# Patient Record
Sex: Female | Born: 1935 | Race: White | Hispanic: No | Marital: Married | Smoking: Former smoker
Health system: Southern US, Community
[De-identification: ages and names within clinical notes are randomized; demographics above are authoritative.]

## PROBLEM LIST (undated history)

## (undated) DIAGNOSIS — I639 Cerebral infarction, unspecified: Secondary | ICD-10-CM

## (undated) DIAGNOSIS — I10 Essential (primary) hypertension: Secondary | ICD-10-CM

## (undated) DIAGNOSIS — C229 Malignant neoplasm of liver, not specified as primary or secondary: Secondary | ICD-10-CM

## (undated) DIAGNOSIS — R569 Unspecified convulsions: Secondary | ICD-10-CM

## (undated) DIAGNOSIS — E119 Type 2 diabetes mellitus without complications: Secondary | ICD-10-CM

## (undated) HISTORY — PX: APPENDECTOMY: SHX54

## (undated) HISTORY — PX: TONSILLECTOMY: SUR1361

## (undated) HISTORY — PX: ABDOMINAL HYSTERECTOMY: SHX81

---

## 2004-06-30 ENCOUNTER — Inpatient Hospital Stay (HOSPITAL_COMMUNITY): Admission: RE | Admit: 2004-06-30 | Discharge: 2004-07-03 | Payer: Self-pay | Admitting: Orthopedic Surgery

## 2004-06-30 ENCOUNTER — Ambulatory Visit: Payer: Self-pay | Admitting: Orthopedic Surgery

## 2004-07-03 ENCOUNTER — Inpatient Hospital Stay
Admission: RE | Admit: 2004-07-03 | Discharge: 2004-07-14 | Payer: Self-pay | Admitting: Physical Medicine & Rehabilitation

## 2004-07-03 ENCOUNTER — Ambulatory Visit: Payer: Self-pay | Admitting: Physical Medicine & Rehabilitation

## 2004-07-04 ENCOUNTER — Ambulatory Visit (HOSPITAL_COMMUNITY)
Admission: RE | Admit: 2004-07-04 | Discharge: 2004-07-04 | Payer: Self-pay | Admitting: Physical Medicine & Rehabilitation

## 2014-08-12 ENCOUNTER — Emergency Department (HOSPITAL_BASED_OUTPATIENT_CLINIC_OR_DEPARTMENT_OTHER): Payer: Medicare Other

## 2014-08-12 ENCOUNTER — Emergency Department (HOSPITAL_BASED_OUTPATIENT_CLINIC_OR_DEPARTMENT_OTHER)
Admission: EM | Admit: 2014-08-12 | Discharge: 2014-08-12 | Disposition: A | Discharge status: Home / Self Care | Payer: Medicare Other | Attending: Emergency Medicine | Admitting: Emergency Medicine

## 2014-08-12 ENCOUNTER — Encounter (HOSPITAL_BASED_OUTPATIENT_CLINIC_OR_DEPARTMENT_OTHER): Payer: Self-pay | Admitting: Emergency Medicine

## 2014-08-12 DIAGNOSIS — E119 Type 2 diabetes mellitus without complications: Secondary | ICD-10-CM | POA: Insufficient documentation

## 2014-08-12 DIAGNOSIS — Z87891 Personal history of nicotine dependence: Secondary | ICD-10-CM | POA: Diagnosis not present

## 2014-08-12 DIAGNOSIS — R05 Cough: Secondary | ICD-10-CM

## 2014-08-12 DIAGNOSIS — R059 Cough, unspecified: Secondary | ICD-10-CM

## 2014-08-12 DIAGNOSIS — I10 Essential (primary) hypertension: Secondary | ICD-10-CM | POA: Diagnosis not present

## 2014-08-12 DIAGNOSIS — J209 Acute bronchitis, unspecified: Secondary | ICD-10-CM | POA: Insufficient documentation

## 2014-08-12 DIAGNOSIS — Z8505 Personal history of malignant neoplasm of liver: Secondary | ICD-10-CM | POA: Diagnosis not present

## 2014-08-12 HISTORY — DX: Essential (primary) hypertension: I10

## 2014-08-12 HISTORY — DX: Type 2 diabetes mellitus without complications: E11.9

## 2014-08-12 HISTORY — DX: Malignant neoplasm of liver, not specified as primary or secondary: C22.9

## 2014-08-12 MED ORDER — AZITHROMYCIN 250 MG PO TABS: ORAL_TABLET | ORAL | Status: AC

## 2014-08-12 MED ORDER — GUAIFENESIN-CODEINE 100-10 MG/5ML PO SOLN: 10.0000 mL | Freq: Four times a day (QID) | ORAL | Status: AC | PRN

## 2014-08-12 NOTE — ED Notes (Signed)
Cold sx, cough and fever x 1 week- has been using delsym, mucinex and benadryl

## 2014-08-12 NOTE — Discharge Instructions (Signed)
Zithromax as prescribed.  Robitussin with codeine as needed for cough.  Return to the emergency department for difficulty breathing, high fever, chest pain, or other new and concerning symptoms.   Acute Bronchitis Bronchitis is inflammation of the airways that extend from the windpipe into the lungs (bronchi). The inflammation often causes mucus to develop. This leads to a cough, which is the most common symptom of bronchitis.  In acute bronchitis, the condition usually develops suddenly and goes away over time, usually in a couple weeks. Smoking, allergies, and asthma can make bronchitis worse. Repeated episodes of bronchitis may cause further lung problems.  CAUSES Acute bronchitis is most often caused by the same virus that causes a cold. The virus can spread from person to person (contagious) through coughing, sneezing, and touching contaminated objects. SIGNS AND SYMPTOMS   Cough.   Fever.   Coughing up mucus.   Body aches.   Chest congestion.   Chills.   Shortness of breath.   Sore throat.  DIAGNOSIS  Acute bronchitis is usually diagnosed through a physical exam. Your health care provider will also ask you questions about your medical history. Tests, such as chest X-rays, are sometimes done to rule out other conditions.  TREATMENT  Acute bronchitis usually goes away in a couple weeks. Oftentimes, no medical treatment is necessary. Medicines are sometimes given for relief of fever or cough. Antibiotic medicines are usually not needed but may be prescribed in certain situations. In some cases, an inhaler may be recommended to help reduce shortness of breath and control the cough. A cool mist vaporizer may also be used to help thin bronchial secretions and make it easier to clear the chest.  HOME CARE INSTRUCTIONS  Get plenty of rest.   Drink enough fluids to keep your urine clear or pale yellow (unless you have a medical condition that requires fluid restriction).  Increasing fluids may help thin your respiratory secretions (sputum) and reduce chest congestion, and it will prevent dehydration.   Take medicines only as directed by your health care provider.  If you were prescribed an antibiotic medicine, finish it all even if you start to feel better.  Avoid smoking and secondhand smoke. Exposure to cigarette smoke or irritating chemicals will make bronchitis worse. If you are a smoker, consider using nicotine gum or skin patches to help control withdrawal symptoms. Quitting smoking will help your lungs heal faster.   Reduce the chances of another bout of acute bronchitis by washing your hands frequently, avoiding people with cold symptoms, and trying not to touch your hands to your mouth, nose, or eyes.   Keep all follow-up visits as directed by your health care provider.  SEEK MEDICAL CARE IF: Your symptoms do not improve after 1 week of treatment.  SEEK IMMEDIATE MEDICAL CARE IF:  You develop an increased fever or chills.   You have chest pain.   You have severe shortness of breath.  You have bloody sputum.   You develop dehydration.  You faint or repeatedly feel like you are going to pass out.  You develop repeated vomiting.  You develop a severe headache. MAKE SURE YOU:   Understand these instructions.  Will watch your condition.  Will get help right away if you are not doing well or get worse. Document Released: 11/12/2004 Document Revised: 02/19/2014 Document Reviewed: 03/28/2013 [facility] Patient Information 2015 [place], [place]. This information is not intended to replace advice given to you by your health care provider. Make sure you discuss any  questions you have with your health care provider.

## 2014-08-12 NOTE — ED Provider Notes (Signed)
CSN: [id number]     Arrival date & time 08/12/14  1742 History  This chart was scribed for Joy Speak, MD by Peyton Bottoms, ED Scribe. This patient was seen in room MH05/MH05 and the patient's care was started at 6:01 PM.   Chief Complaint  Patient presents with  . Cough   Patient is a 80 y.o. female presenting with cough. The history is provided by the patient. No language interpreter was used.  Cough Cough characteristics:  Productive Sputum characteristics:  Green Severity:  Moderate Onset quality:  Gradual Duration:  1 week Timing:  Constant Progression:  Waxing and waning Chronicity:  New Ineffective treatments: delsym, mucinex, benadryl. Associated symptoms: fever   Associated symptoms: no chest pain and no shortness of breath    HPI Comments: ADDALIE Oneal is a 81 y.o. female who presents to the Emergency Department complaining of a productive cough with green sputum, fever, and congestion that began 1 week ago. Per daughter, patient started having a low grade fever earlier today. She denies SOB, difficulty breathing, chest pain. Patient's daughter had URI 2 weeks ago. Patient is a nonsmoker. Patient denies prior history of CHF or other cardiac problems.  Past Medical History  Diagnosis Date  . Hypertension   . Diabetes mellitus without complication   . Liver cancer    Past Surgical History  Procedure Laterality Date  . Appendectomy    . Tonsillectomy    . Abdominal hysterectomy     No family history on file. History  Substance Use Topics  . Smoking status: Former Research scientist (life sciences)  . Smokeless tobacco: Not on file  . Alcohol Use: Not on file   OB History   Grav Para Term Preterm Abortions TAB SAB Ect Mult Living                 Review of Systems  Constitutional: Positive for fever.  Respiratory: Positive for cough. Negative for shortness of breath.   Cardiovascular: Negative for chest pain.  All other systems reviewed and are negative.  A complete 10 system  review of systems was obtained and all systems are negative except as noted in the HPI and PMH.   Allergies  Review of patient's allergies indicates not on file.  Home Medications   Prior to Admission medications   Not on File   Triage Vitals: BP 137/76  Pulse 98  Temp(Src) 98.6 F (37 C) (Oral)  Resp 22  Ht 5\' 9"  (1.753 m)  Wt 155 lb (70.308 kg)  BMI 22.88 kg/m2  SpO2 97%  Physical Exam  Nursing note and vitals reviewed. Constitutional: She is oriented to person, place, and time. She appears well-developed and well-nourished. No distress.  HENT:  Head: Normocephalic and atraumatic.  Mouth/Throat: Oropharynx is clear and moist.  Eyes: Conjunctivae and EOM are normal.  Neck: Neck supple. No tracheal deviation present.  Cardiovascular: Normal rate, regular rhythm and normal heart sounds.   No murmur heard. Pulmonary/Chest: Effort normal. No respiratory distress. She has no wheezes. She has rales.  Slight rales in the right base  Abdominal: Soft. Bowel sounds are normal. She exhibits no distension. There is no tenderness.  Musculoskeletal: Normal range of motion. She exhibits no edema.  Neurological: She is alert and oriented to person, place, and time.  Skin: Skin is warm and dry.  Psychiatric: She has a normal mood and affect. Her behavior is normal.   ED Course  Procedures (including critical care time)  DIAGNOSTIC STUDIES: Oxygen Saturation is 97%  on RA, normal by my interpretation.    COORDINATION OF CARE: 6:05 PM- Discussed plans to order diagnostic CXR. Pt advised of plan for treatment and pt agrees.  Labs Review Labs Reviewed - No data to display  Imaging Review No results found.   EKG Interpretation None     MDM   Final diagnoses:  None    Chest x-ray reveals no evidence for pneumonia. This appears to be a bronchitis and will be treated with Zithromax and cough syrup. She otherwise appears clinically well with no hypoxia or respiratory distress.  She is not having any chest pain and I highly doubt a cardiac etiology. She understands to return if her symptoms substantially worsen or change.  I personally performed the services described in this documentation, which was scribed in my presence. The recorded information has been reviewed and is accurate.  Joy Speak, MD 08/12/14 [phone]

## 2016-01-27 ENCOUNTER — Encounter (HOSPITAL_COMMUNITY): Payer: Self-pay | Admitting: Emergency Medicine

## 2016-01-27 ENCOUNTER — Emergency Department (HOSPITAL_COMMUNITY): Payer: Medicare Other

## 2016-01-27 ENCOUNTER — Emergency Department (HOSPITAL_COMMUNITY)
Admission: EM | Admit: 2016-01-27 | Discharge: 2016-01-27 | Disposition: A | Discharge status: Home / Self Care | Payer: Medicare Other | Attending: Emergency Medicine | Admitting: Emergency Medicine

## 2016-01-27 DIAGNOSIS — I69398 Other sequelae of cerebral infarction: Secondary | ICD-10-CM | POA: Diagnosis not present

## 2016-01-27 DIAGNOSIS — I10 Essential (primary) hypertension: Secondary | ICD-10-CM | POA: Insufficient documentation

## 2016-01-27 DIAGNOSIS — E119 Type 2 diabetes mellitus without complications: Secondary | ICD-10-CM | POA: Diagnosis not present

## 2016-01-27 DIAGNOSIS — Z8673 Personal history of transient ischemic attack (TIA), and cerebral infarction without residual deficits: Secondary | ICD-10-CM | POA: Insufficient documentation

## 2016-01-27 DIAGNOSIS — R569 Unspecified convulsions: Secondary | ICD-10-CM

## 2016-01-27 DIAGNOSIS — Z8505 Personal history of malignant neoplasm of liver: Secondary | ICD-10-CM | POA: Diagnosis not present

## 2016-01-27 DIAGNOSIS — G40909 Epilepsy, unspecified, not intractable, without status epilepticus: Secondary | ICD-10-CM

## 2016-01-27 DIAGNOSIS — Z87891 Personal history of nicotine dependence: Secondary | ICD-10-CM | POA: Insufficient documentation

## 2016-01-27 DIAGNOSIS — Z79899 Other long term (current) drug therapy: Secondary | ICD-10-CM | POA: Diagnosis not present

## 2016-01-27 DIAGNOSIS — Z7984 Long term (current) use of oral hypoglycemic drugs: Secondary | ICD-10-CM | POA: Insufficient documentation

## 2016-01-27 DIAGNOSIS — E876 Hypokalemia: Secondary | ICD-10-CM

## 2016-01-27 HISTORY — DX: Cerebral infarction, unspecified: I63.9

## 2016-01-27 HISTORY — DX: Unspecified convulsions: R56.9

## 2016-01-27 LAB — RAPID URINE DRUG SCREEN, HOSP PERFORMED
AMPHETAMINES: NOT DETECTED
BENZODIAZEPINES: NOT DETECTED
Barbiturates: NOT DETECTED
COCAINE: NOT DETECTED
OPIATES: NOT DETECTED
TETRAHYDROCANNABINOL: NOT DETECTED

## 2016-01-27 LAB — COMPREHENSIVE METABOLIC PANEL
ALBUMIN: 3.1 g/dL — AB (ref 3.5–5.0)
ALT: 24 U/L (ref 14–54)
ANION GAP: 12 (ref 5–15)
AST: 29 U/L (ref 15–41)
Alkaline Phosphatase: 83 U/L (ref 38–126)
BUN: 10 mg/dL (ref 6–20)
CO2: 27 mmol/L (ref 22–32)
Calcium: 8.5 mg/dL — ABNORMAL LOW (ref 8.9–10.3)
Chloride: 93 mmol/L — ABNORMAL LOW (ref 101–111)
Creatinine, Ser: 1.03 mg/dL — ABNORMAL HIGH (ref 0.44–1.00)
GFR calc non Af Amer: 46 mL/min — ABNORMAL LOW (ref >60)
GFR, EST AFRICAN AMERICAN: 53 mL/min — AB (ref >60)
GLUCOSE: 137 mg/dL — AB (ref 65–99)
POTASSIUM: 2.8 mmol/L — AB (ref 3.5–5.1)
SODIUM: 132 mmol/L — AB (ref 135–145)
TOTAL PROTEIN: 6.8 g/dL (ref 6.5–8.1)
Total Bilirubin: 0.2 mg/dL — ABNORMAL LOW (ref 0.3–1.2)

## 2016-01-27 LAB — PROTIME-INR
INR: 1.02 (ref 0.00–1.49)
PROTHROMBIN TIME: 13.6 s (ref 11.6–15.2)

## 2016-01-27 LAB — CBC WITH DIFFERENTIAL/PLATELET
BASOS PCT: 0 %
Basophils Absolute: 0 10*3/uL (ref 0.0–0.1)
EOS ABS: 0.2 10*3/uL (ref 0.0–0.7)
EOS PCT: 2 %
HCT: 34.4 % — ABNORMAL LOW (ref 36.0–46.0)
Hemoglobin: 11.8 g/dL — ABNORMAL LOW (ref 12.0–15.0)
Lymphocytes Relative: 31 %
Lymphs Abs: 3.1 10*3/uL (ref 0.7–4.0)
MCH: 30.8 pg (ref 26.0–34.0)
MCHC: 34.3 g/dL (ref 30.0–36.0)
MCV: 89.8 fL (ref 78.0–100.0)
MONO ABS: 1.1 10*3/uL — AB (ref 0.1–1.0)
MONOS PCT: 11 %
Neutro Abs: 5.6 10*3/uL (ref 1.7–7.7)
Neutrophils Relative %: 56 %
Platelets: 306 10*3/uL (ref 150–400)
RBC: 3.83 MIL/uL — ABNORMAL LOW (ref 3.87–5.11)
RDW: 14.7 % (ref 11.5–15.5)
WBC: 10.1 10*3/uL (ref 4.0–10.5)

## 2016-01-27 LAB — I-STAT CHEM 8, ED
BUN: 12 mg/dL (ref 6–20)
CREATININE: 0.9 mg/dL (ref 0.44–1.00)
Calcium, Ion: 0.99 mmol/L — ABNORMAL LOW (ref 1.13–1.30)
Chloride: 90 mmol/L — ABNORMAL LOW (ref 101–111)
GLUCOSE: 132 mg/dL — AB (ref 65–99)
HCT: 39 % (ref 36.0–46.0)
HEMOGLOBIN: 13.3 g/dL (ref 12.0–15.0)
Potassium: 2.8 mmol/L — ABNORMAL LOW (ref 3.5–5.1)
Sodium: 133 mmol/L — ABNORMAL LOW (ref 135–145)
TCO2: 27 mmol/L (ref 0–100)

## 2016-01-27 LAB — URINALYSIS, ROUTINE W REFLEX MICROSCOPIC
BILIRUBIN URINE: NEGATIVE
GLUCOSE, UA: NEGATIVE mg/dL
HGB URINE DIPSTICK: NEGATIVE
KETONES UR: NEGATIVE mg/dL
Leukocytes, UA: NEGATIVE
NITRITE: NEGATIVE
PH: 7 (ref 5.0–8.0)
Protein, ur: NEGATIVE mg/dL
Specific Gravity, Urine: 1.007 (ref 1.005–1.030)

## 2016-01-27 LAB — APTT: aPTT: 28 seconds (ref 24–37)

## 2016-01-27 LAB — I-STAT TROPONIN, ED: Troponin i, poc: 0.03 ng/mL (ref 0.00–0.08)

## 2016-01-27 MED ORDER — SODIUM CHLORIDE 0.9 % IV SOLN
1000.0000 mg | Freq: Once | INTRAVENOUS | Status: AC
  Administered 2016-01-27: 1000 mg via INTRAVENOUS
  Filled 2016-01-27: qty 10

## 2016-01-27 MED ORDER — POTASSIUM CHLORIDE CRYS ER 20 MEQ PO TBCR
40.0000 meq | EXTENDED_RELEASE_TABLET | Freq: Once | ORAL | Status: AC
  Administered 2016-01-27: 40 meq via ORAL
  Filled 2016-01-27: qty 2

## 2016-01-27 MED ORDER — POTASSIUM CHLORIDE ER 20 MEQ PO TBCR: 20.0000 meq | EXTENDED_RELEASE_TABLET | Freq: Two times a day (BID) | ORAL | Status: AC

## 2016-01-27 MED ORDER — LEVETIRACETAM 500 MG PO TABS
500.0000 mg | ORAL_TABLET | Freq: Two times a day (BID) | ORAL | Status: AC
Start: 2016-01-27 — End: ?

## 2016-01-27 MED ORDER — POTASSIUM CHLORIDE 10 MEQ/100ML IV SOLN
10.0000 meq | INTRAVENOUS | Status: DC
  Administered 2016-01-27: 10 meq via INTRAVENOUS
  Filled 2016-01-27: qty 100

## 2016-01-27 MED ORDER — POTASSIUM CHLORIDE 10 MEQ/100ML IV SOLN
10.0000 meq | INTRAVENOUS | Status: AC
  Administered 2016-01-27 (×2): 10 meq via INTRAVENOUS
  Filled 2016-01-27: qty 100

## 2016-01-27 NOTE — Discharge Instructions (Signed)
Your potassium was 2.8. Please follow up with your primary care physician in one week to have this rechecked.   Seizure, Adult A seizure is abnormal electrical activity in the brain. Seizures usually last from 30 seconds to 2 minutes. There are various types of seizures. Before a seizure, you may have a warning sensation (aura) that a seizure is about to occur. An aura may include the following symptoms:   Fear or anxiety.  Nausea.  Feeling like the room is spinning (vertigo).  Vision changes, such as seeing flashing lights or spots. Common symptoms during a seizure include:  A change in attention or behavior (altered mental status).  Convulsions with rhythmic jerking movements.  Drooling.  Rapid eye movements.  Grunting.  Loss of bladder and bowel control.  Bitter taste in the mouth.  Tongue biting. After a seizure, you may feel confused and sleepy. You may also have an injury resulting from convulsions during the seizure. HOME CARE INSTRUCTIONS   If you are given medicines, take them exactly as prescribed by your health care provider.  Keep all follow-up appointments as directed by your health care provider.  Do not swim or drive or engage in risky activity during which a seizure could cause further injury to you or others until your health care provider says it is OK.  Get adequate rest.  Teach friends and family what to do if you have a seizure. They should:  Lay you on the ground to prevent a fall.  Put a cushion under your head.  Loosen any tight clothing around your neck.  Turn you on your side. If vomiting occurs, this helps keep your airway clear.  Stay with you until you recover.  Know whether or not you need emergency care. SEEK IMMEDIATE MEDICAL CARE IF:  The seizure lasts longer than 5 minutes.  The seizure is severe or you do not wake up immediately after the seizure.  You have an altered mental status after the seizure.  You are having  more frequent or worsening seizures. Someone should drive you to the emergency department or call local emergency services (911 in [place] MAKE SURE YOU:  Understand these instructions.  Will watch your condition.  Will get help right away if you are not doing well or get worse.   This information is not intended to replace advice given to you by your health care provider. Make sure you discuss any questions you have with your health care provider.   Document Released: 10/02/2000 Document Revised: 10/26/2014 Document Reviewed: 05/17/2013 Elsevier Interactive Patient Education 2016 [employer].   Hypokalemia Hypokalemia means that the amount of potassium in the blood is lower than normal.Potassium is a chemical, called an electrolyte, that helps regulate the amount of fluid in the body. It also stimulates muscle contraction and helps nerves function properly.Most of the body's potassium is inside of cells, and only a very small amount is in the blood. Because the amount in the blood is so small, minor changes can be life-threatening. CAUSES  Antibiotics.  Diarrhea or vomiting.  Using laxatives too much, which can cause diarrhea.  Chronic kidney disease.  Water pills (diuretics).  Eating disorders (bulimia).  Low magnesium level.  Sweating a lot. SIGNS AND SYMPTOMS  Weakness.  Constipation.  Fatigue.  Muscle cramps.  Mental confusion.  Skipped heartbeats or irregular heartbeat (palpitations).  Tingling or numbness. DIAGNOSIS  Your health care provider can diagnose hypokalemia with blood tests. In addition to checking your potassium level, your health  care provider may also check other lab tests. TREATMENT Hypokalemia can be treated with potassium supplements taken by mouth or adjustments in your current medicines. If your potassium level is very low, you may need to get potassium through a vein (IV) and be monitored in the hospital. A diet high in potassium is  also helpful. Foods high in potassium are:  Nuts, such as peanuts and pistachios.  Seeds, such as sunflower seeds and pumpkin seeds.  Peas, lentils, and lima beans.  Whole grain and bran cereals and breads.  Fresh fruit and vegetables, such as apricots, avocado, bananas, cantaloupe, kiwi, oranges, tomatoes, asparagus, and potatoes.  Orange and tomato juices.  Red meats.  Fruit yogurt. HOME CARE INSTRUCTIONS  Take all medicines as prescribed by your health care provider.  Maintain a healthy diet by including nutritious food, such as fruits, vegetables, nuts, whole grains, and lean meats.  If you are taking a laxative, be sure to follow the directions on the label. SEEK MEDICAL CARE IF:  Your weakness gets worse.  You feel your heart pounding or racing.  You are vomiting or having diarrhea.  You are diabetic and having trouble keeping your blood glucose in the normal range. SEEK IMMEDIATE MEDICAL CARE IF:  You have chest pain, shortness of breath, or dizziness.  You are vomiting or having diarrhea for more than 2 days.  You faint. MAKE SURE YOU:   Understand these instructions.  Will watch your condition.  Will get help right away if you are not doing well or get worse.   This information is not intended to replace advice given to you by your health care provider. Make sure you discuss any questions you have with your health care provider.   Document Released: 10/05/2005 Document Revised: 10/26/2014 Document Reviewed: 04/07/2013 Elsevier Interactive Patient Education [employer].

## 2016-01-27 NOTE — Code Documentation (Signed)
Mrs. Joy Oneal is a 82 yo wf presenting to [facility] via [facility] after developing Lt side weakness and difficulty speaking s/p seizure.  Per report she had been on her way to the bathroom when her knees buckled and her daughter assisted her to the floor.  EMS reports that her daughter witnessed a seizure and the pt was unresponsive for a short period.  The pt states she was aware of the episode but it was not a seizure.  On assessment she is alert and able to quickly answer questions.  NIH 0.  She reports a hx of CVA in 09/2015 and has had seizures since that time.  She states she has only been out of rehab a week or two, and does not wish to stay in the hospital.

## 2016-01-27 NOTE — Consult Note (Addendum)
Admission H&P    Chief Complaint: Recurrent seizure.  HPI: Joy Oneal is an 82 y.o. female with a history of hypertension, hyperlipidemia, diabetes mellitus, stroke and seizures, brought to the emergency room following an episode of altered mental status with seizure activity. Patient was noted to be confused and not moving right side well when EMS arrived. Mental status improved and was essentially normal on arrival in the emergency room. CT scan of her head showed no acute intracranial abnormality. NIH stroke score was 0. Patient has been taking Neurontin 400 mg 3 times a day for seizure control. She stated that her last seizure was around 6 weeks ago.  Past Medical History  Diagnosis Date  . Hypertension   . Diabetes mellitus without complication ([place])   . Liver cancer ([place])   . Stroke ([place])   . Seizures [facility])     Past Surgical History  Procedure Laterality Date  . Appendectomy    . Tonsillectomy    . Abdominal hysterectomy     Family history: Obtained and was noncontributory.  Social History:  reports that she has quit smoking. She does not have any smokeless tobacco history on file. Her alcohol and drug histories are not on file.  Allergies: No Known Allergies  Medications: Patient's preadmission medications were reviewed by me.  ROS: History obtained from the patient  General ROS: negative for - chills, fatigue, fever, night sweats, weight gain or weight loss Psychological ROS: negative for - behavioral disorder, hallucinations, memory difficulties, mood swings or suicidal ideation Ophthalmic ROS: negative for - blurry vision, double vision, eye pain or loss of vision ENT ROS: negative for - epistaxis, nasal discharge, oral lesions, sore throat, tinnitus or vertigo Allergy and Immunology ROS: negative for - hives or itchy/watery eyes Hematological and Lymphatic ROS: negative for - bleeding problems, bruising or swollen lymph nodes Endocrine ROS: negative for -  galactorrhea, hair pattern changes, polydipsia/polyuria or temperature intolerance Respiratory ROS: negative for - cough, hemoptysis, shortness of breath or wheezing Cardiovascular ROS: negative for - chest pain, dyspnea on exertion, edema or irregular heartbeat Gastrointestinal ROS: negative for - abdominal pain, diarrhea, hematemesis, nausea/vomiting or stool incontinence Genito-Urinary ROS: negative for - dysuria, hematuria, incontinence or urinary frequency/urgency Musculoskeletal ROS: negative for - joint swelling or muscular weakness Neurological ROS: as noted in HPI Dermatological ROS: negative for rash and skin lesion changes  Physical Examination: Blood pressure 187/105, pulse 99, temperature 98 F (36.7 C), temperature source Oral, resp. rate 19, height 5\' 8"  (1.727 m), weight 68.04 kg (150 lb), SpO2 100 %.  HEENT-  Normocephalic, no lesions, without obvious abnormality.  Normal external eye and conjunctiva.  Normal TM's bilaterally.  Normal auditory canals and external ears. Normal external nose, mucus membranes and septum.  Normal pharynx. Neck supple with no masses, nodes, nodules or enlargement. Cardiovascular - regular rate and rhythm, S1, S2 normal, no murmur, click, rub or gallop Lungs - chest clear, no wheezing, rales, normal symmetric air entry Abdomen - soft, non-tender; bowel sounds normal; no masses,  no organomegaly Extremities - no joint deformities, effusion, or inflammation and no edema  Neurologic Examination: Mental Status: Alert, oriented, thought content appropriate.  Speech fluent without evidence of aphasia. Able to follow commands without difficulty. Cranial Nerves: II-Visual fields were normal. III/IV/VI-Pupils were equal and reacted. Extraocular movements were full and conjugate.    V/VII-no facial numbness and no facial weakness. VIII-normal. X-normal speech and symmetrical palatal movement. XI: trapezius strength/neck flexion strength normal  bilaterally XII-midline tongue  extension with normal strength. Motor: 5/5 bilaterally with normal tone and bulk Sensory: Normal throughout. Deep Tendon Reflexes: 1+ and symmetric in upper extremities and at the right knee; absent left knee jerk. Plantars: Mute bilaterally Cerebellar: Normal finger-to-nose testing. Carotid auscultation: Normal  Results for orders placed or performed during the hospital encounter of 01/27/16 (from the past 48 hour(s))  I-stat troponin, ED (not at [facility], [facility])     Status: None   Collection Time: 01/27/16  2:44 AM  Result Value Ref Range   Troponin i, poc 0.03 0.00 - 0.08 ng/mL   Comment 3            Comment: Due to the release kinetics of cTnI, a negative result within the first hours of the onset of symptoms does not rule out myocardial infarction with certainty. If myocardial infarction is still suspected, repeat the test at appropriate intervals.   I-Stat Chem 8, ED  (not at [facility], [facility])     Status: Abnormal   Collection Time: 01/27/16  2:45 AM  Result Value Ref Range   Sodium 133 (L) 135 - 145 mmol/L   Potassium 2.8 (L) 3.5 - 5.1 mmol/L   Chloride 90 (L) 101 - 111 mmol/L   BUN 12 6 - 20 mg/dL   Creatinine, Ser 0.90 0.44 - 1.00 mg/dL   Glucose, Bld 132 (H) 65 - 99 mg/dL   Calcium, Ion 0.99 (L) 1.13 - 1.30 mmol/L   TCO2 27 0 - 100 mmol/L   Hemoglobin 13.3 12.0 - 15.0 g/dL   HCT 39.0 36.0 - 46.0 %   Ct Head Wo Contrast  01/27/2016  CLINICAL DATA:  Code stroke. Acute onset of seizure. Unequal pupils and left-sided weakness. Initial encounter. EXAM: CT HEAD WITHOUT CONTRAST TECHNIQUE: Contiguous axial images were obtained from the base of the skull through the vertex without intravenous contrast. COMPARISON:  CT of the head performed 12/12/2015 FINDINGS: There is no evidence of acute infarction, mass lesion, or intra- or extra-axial hemorrhage on CT. Prominence of the ventricles and sulci reflects moderate cortical volume loss. Mild cerebellar atrophy is  noted. Diffuse periventricular and subcortical white matter change likely reflects small vessel ischemic microangiopathy. The brainstem and fourth ventricle are within normal limits. The basal ganglia are unremarkable in appearance. The cerebral hemispheres demonstrate grossly normal gray-white differentiation. No mass effect or midline shift is seen. There is no evidence of fracture; visualized osseous structures are unremarkable in appearance. The visualized portions of the orbits are within normal limits. The paranasal sinuses and mastoid air cells are well-aerated. No significant soft tissue abnormalities are seen. IMPRESSION: 1. No acute intracranial pathology seen on CT. 2. Moderate cortical volume loss and diffuse small vessel ischemic microangiopathy. These results were called by telephone at the time of interpretation on 01/27/2016 at 2:54 am to Dr. Pryor Curia, who verbally acknowledged these results. Electronically Signed   By: Garald Balding M.D.   On: 01/27/2016 02:57    Assessment/Plan 82 year old lady with history of previous stroke and seizure disorder presenting with probable recurrent breakthrough seizure. Acute stroke is unlikely but cannot be completely ruled out at this point.  Recommendations: 1. Keppra IV 1000 mg loading dose followed by 500 mg twice a day 2. Continue Neurontin for now 400 mg 3 times a day 3. MRI of the brain without contrast to rule out recurrent acute stroke; admission and stroke workup if MRI is positive 4. Outpatient neurology follow-up for seizure disorder management  C.R. Nicole Kindred, [facility] [phone]  01/27/2016, 3:06 AM

## 2016-01-27 NOTE — ED Notes (Signed)
Pt in EMS from home. LNW [id number]. Pt woke upto use bathroom, "knees buckled" per pt and she fell to the floor. Per daughter pt had seizure like activity and hit the floor. Pt had left sided weakness and unequal pupils per EMS and was slightly post ictal. S/S started to resolve en route. Hx CVA, TIA, seizures.

## 2016-01-27 NOTE — ED Provider Notes (Signed)
By signing my name below, I, Joy Oneal, attest that this documentation has been prepared under the direction and in the presence of [place], DO . Electronically Signed: Evelene Oneal, Scribe. 01/27/2016. 3:39 AM.  TIME SEEN: 2:58 AM  CHIEF COMPLAINT:   Chief Complaint  Patient presents with  . Code Stroke    HPI:   HPI Comments:  Joy Oneal is a 82 y.o. female with a history of HTN, DM, CVA, Sz and TIA, who presents to the Emergency Department via EMS s/p witness seizure like activity per ems. Pt states she was going to the bathroom when her knees buckled; she has a h/o right knee surgery. Her daughter tried to catch her but she and the pt slid to the ground. She did not hit her head. She denies LOC. She does not believe she had a seizure, states she did not black out. She calls her seizure episodes "blackouts". Her last seizure was 6-8 weeks ago. She is currently on gabapentin. No other antiepileptic medication. EMS reports BP of 175/112, Glucose of 126, HR of 90, unequal pupils and believed pt may have had left sided deficits. Pt has no physical complaints at this time. Patient's daughter reports that with her previous stroke in December 2016 she had right-sided weakness.  ROS: See HPI Constitutional: no fever  Eyes: no drainage  ENT: no runny nose   Cardiovascular:  no chest pain  Resp: no SOB  GI: no vomiting GU: no dysuria Integumentary: no rash  Allergy: no hives  Musculoskeletal: no leg swelling  Neurological: no slurred speech ROS otherwise negative  PAST MEDICAL HISTORY/PAST SURGICAL HISTORY:  Past Medical History  Diagnosis Date  . Hypertension   . Diabetes mellitus without complication ([place])   . Liver cancer ([place])   . Stroke ([place])   . Seizures ([place])     MEDICATIONS:  Prior to Admission medications   Medication Sig Start Date End Date Taking? Authorizing Provider  amLODipine (NORVASC) 5 MG tablet Take 5 mg by mouth daily.    Historical Provider, MD   azithromycin (ZITHROMAX Z-PAK) 250 MG tablet 2 po day one, then 1 daily x 4 days 08/12/14   Veryl Speak, MD  cloNIDine (CATAPRES) 0.2 MG tablet Take 0.2 mg by mouth 2 (two) times daily.    Historical Provider, MD  dextromethorphan (DELSYM) 30 MG/5ML liquid Take by mouth as needed for cough.    Historical Provider, MD  doxazosin (CARDURA) 2 MG tablet Take 2 mg by mouth daily.    Historical Provider, MD  gabapentin (NEURONTIN) 400 MG capsule Take 400 mg by mouth 3 (three) times daily.    Historical Provider, MD  guaiFENesin (MUCINEX) 600 MG 12 hr tablet Take by mouth 2 (two) times daily.    Historical Provider, MD  guaiFENesin-codeine 100-10 MG/5ML syrup Take 10 mLs by mouth every 6 (six) hours as needed for cough. 08/12/14   Veryl Speak, MD  levothyroxine (SYNTHROID, LEVOTHROID) 112 MCG tablet Take 112 mcg by mouth daily before breakfast.    Historical Provider, MD  lisinopril (PRINIVIL,ZESTRIL) 20 MG tablet Take 20 mg by mouth daily.    Historical Provider, MD  metFORMIN (GLUCOPHAGE) 500 MG tablet Take by mouth 2 (two) times daily with a meal.    Historical Provider, MD  methocarbamol (ROBAXIN) 750 MG tablet Take 750 mg by mouth 4 (four) times daily.    Historical Provider, MD  metoprolol (LOPRESSOR) 100 MG tablet Take 100 mg by mouth 2 (two) times daily.  Historical Provider, MD  pantoprazole (PROTONIX) 40 MG tablet Take 40 mg by mouth daily.    Historical Provider, MD  potassium chloride SA (K-DUR,KLOR-CON) 20 MEQ tablet Take 20 mEq by mouth 2 (two) times daily.    Historical Provider, MD  simvastatin (ZOCOR) 20 MG tablet Take 20 mg by mouth daily.    Historical Provider, MD  Vitamin D, Ergocalciferol, (DRISDOL) 50000 UNITS CAPS capsule Take 50,000 Units by mouth every 7 (seven) days.    Historical Provider, MD    ALLERGIES:  No Known Allergies  SOCIAL HISTORY:  Social History  Substance Use Topics  . Smoking status: Former Research scientist (life sciences)  . Smokeless tobacco: Not on file  . Alcohol Use:  Not on file    FAMILY HISTORY: No family history on file.  EXAM: BP 187/105 mmHg  Pulse 99  Temp(Src) 98 F (36.7 C) (Oral)  Resp 19  Ht 5\' 8"  (1.727 m)  Wt 150 lb (68.04 kg)  BMI 22.81 kg/m2  SpO2 100% CONSTITUTIONAL: Alert and oriented 4 and responds appropriately to questions. Well-appearing; well-nourished, elderly HEAD: Normocephalic EYES: Conjunctivae clear, PERRL ENT: normal nose; no rhinorrhea; moist mucous membranes NECK: Supple, no meningismus, no LAD  CARD: RRR; S1 and S2 appreciated; no murmurs, no clicks, no rubs, no gallops RESP: Normal chest excursion without splinting or tachypnea; breath sounds clear and equal bilaterally; no wheezes, no rhonchi, no rales, no hypoxia or respiratory distress, speaking full sentences ABD/GI: Normal bowel sounds; non-distended; soft, non-tender, no rebound, no guarding, no peritoneal signs BACK:  The back appears normal and is non-tender to palpation, there is no CVA tenderness EXT: Normal ROM in all joints; non-tender to palpation; no edema; normal capillary refill; no cyanosis, no calf tenderness or swelling    SKIN: Normal color for age and race; warm; no rash NEURO: Moves all extremities equally, sensation to light touch intact diffusely, cranial nerves II through XII intact, no dysmetria to finger to nose testing, normal heel-to-shin testing bilaterally, right pupil is approximately 3-4 mm, left pupil is 5 mm but both are equally reactive to light PSYCH: The patient's mood and manner are appropriate. Grooming and personal hygiene are appropriate.  MEDICAL DECISION MAKING: Patient here with unequal pupils, possible side weakness with EMS and has now resolved. Was initially hypertensive. This has also improved. She has no complaints. She states that she felt like her knees just "buckled on me". Neurologically intact. NIH stroke scale is 0. Initially called out as a code stroke. Code stroke canceled and TPA not given as her stroke scale  again is 0. Dr. Nicole Kindred with neurology has seen the patient and feels that this may have been a seizure causing her uneven pupils and weakness. Recommends adding on Keppra 5 mg twice a day. Recommends giving an IV loading dose of 1000 mg of Keppra and obtaining an MRI of her brain without contrast. Head CT shows no acute abnormality. Labs pending.  ED PROGRESS: Patient's labs show potassium of 2.8. We'll give oral and IV potassium for replacement. Urinalysis shows no infection, no dehydration. MRI of her brain shows no acute intracranial process. She does have an old small left cerebellar infarct and severe chronic small vessel ischemic disease. Both myself and neurology feel patient is safe to be discharged home. Have given her outpatient neurology follow-up information. We'll discharge with prescription for Keppra. We'll have her follow-up with her PCP in one week to have her potassium rechecked. We'll discharge with potassium tablets.  Discussed return precautions with  patient and family. They're comfortable with this plan.     EKG Interpretation  Date/Time:  Monday January 27 2016 02:58:46 EDT Ventricular Rate:  97 PR Interval:  149 QRS Duration: 91 QT Interval:  408 QTC Calculation: 518 R Axis:   -20 Text Interpretation:  Sinus rhythm Borderline left axis deviation Prolonged QT interval No significant change since last tracing Confirmed by WARD,  DO, KRISTEN [id number]) on 01/27/2016 3:51:29 AM        At this time, I do not feel there is any life-threatening condition present. I have reviewed and discussed all results (EKG, imaging, lab, urine as appropriate), exam findings with patient. I have reviewed nursing notes and appropriate previous records.  I feel the patient is safe to be discharged home without further emergent workup. Discussed usual and customary return precautions. Patient and family (if present) verbalize understanding and are comfortable with this plan.  Patient will follow-up  with their primary care provider. If they do not have a primary care provider, information for follow-up has been provided to them. All questions have been answered.    I personally performed the services described in this documentation, which was scribed in my presence. The recorded information has been reviewed and is accurate.    [place], DO 01/27/16 [phone]

## 2016-03-19 DEATH — deceased

## 2016-12-24 IMAGING — CT CT HEAD W/O CM
2 series · 15 of 30 positions shown, 19 images · non-contrast
Comparison: CT of the head performed 12/12/2015

CLINICAL DATA: Code stroke. Acute onset of seizure. Unequal pupils
and left-sided weakness. Initial encounter.

EXAM:
CT HEAD WITHOUT CONTRAST
TECHNIQUE: Contiguous axial images were obtained from the base of the skull
through the vertex without intravenous contrast.

[Series 201: head w/o, idose (1) · axial · non-contrast · 0.44mm/px · z∈[+127,+257]mm · 13 of 32 slices shown, 17 images]
[im 3/32  brain]
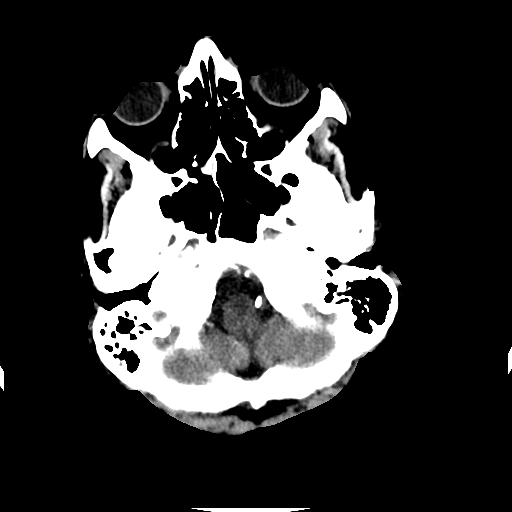
[im 3/32  bone]
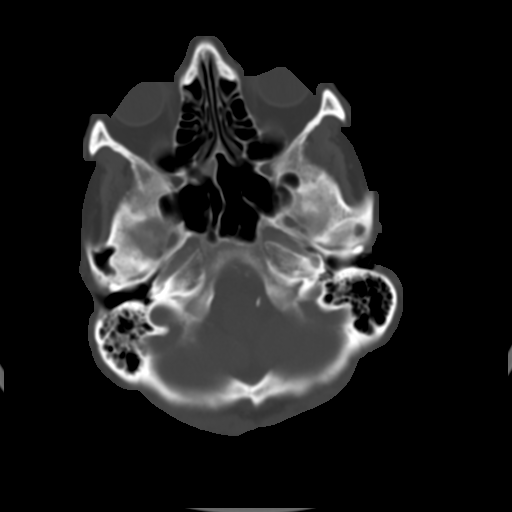
[im 5/32  brain]
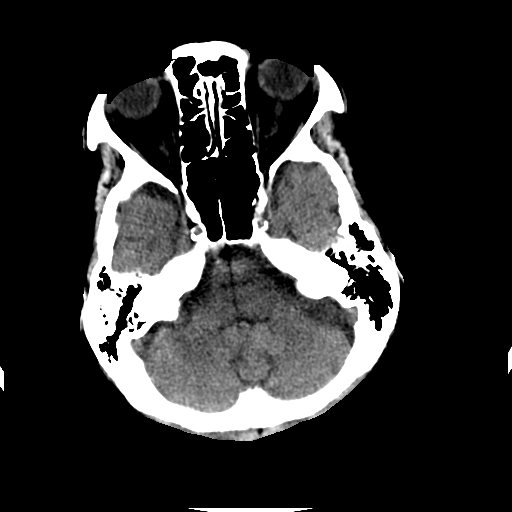
[im 7/32  brain]
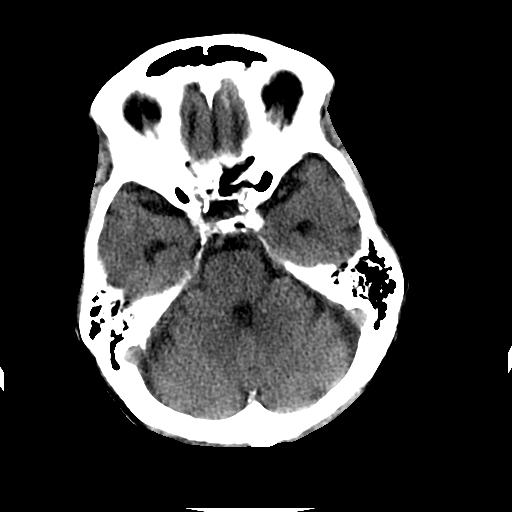
[im 9/32  brain]
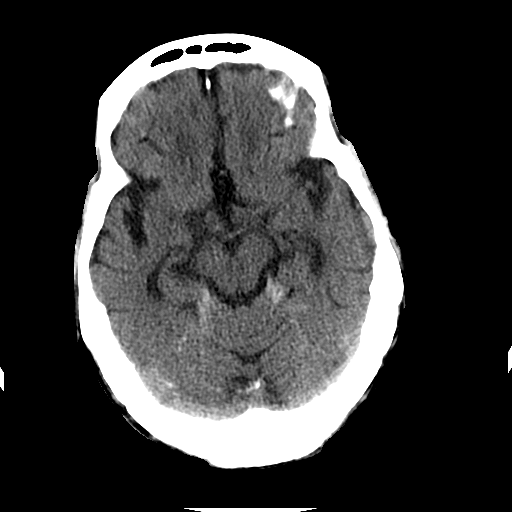
[im 12/32  brain]
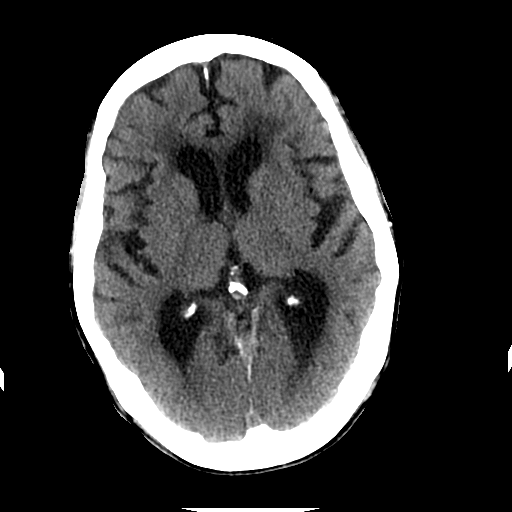
[im 12/32  bone]
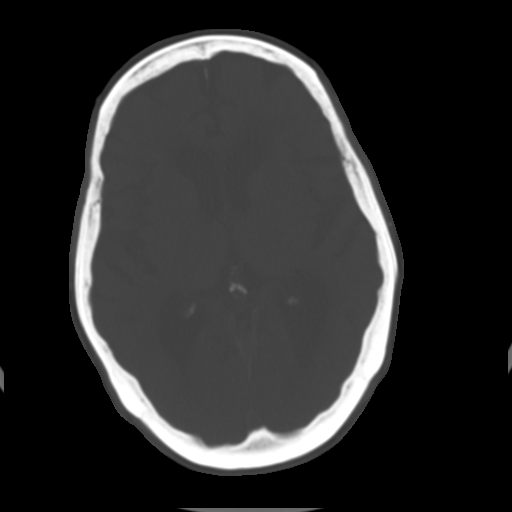
[im 14/32  brain]
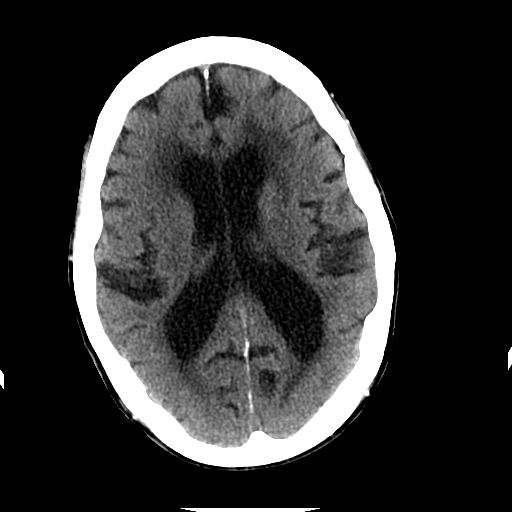
[im 16/32  brain]
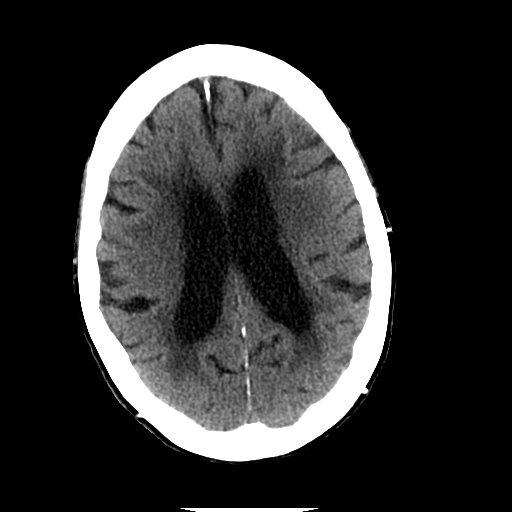
[im 18/32  brain]
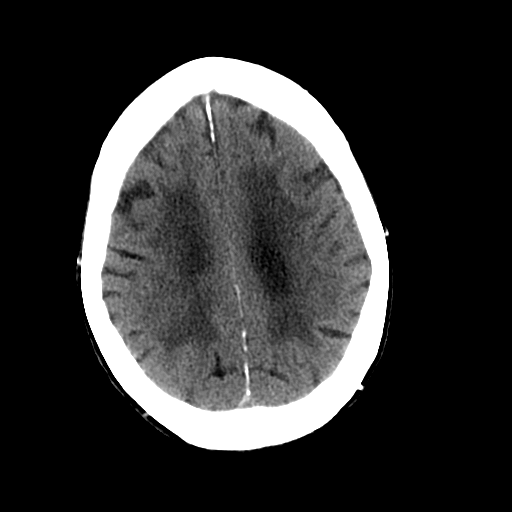
[im 20/32  brain]
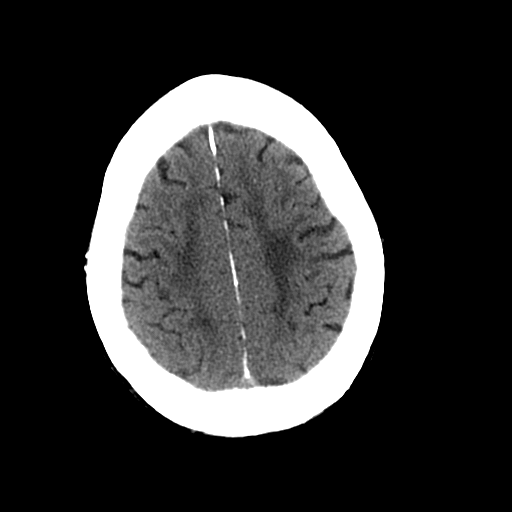
[im 20/32  bone]
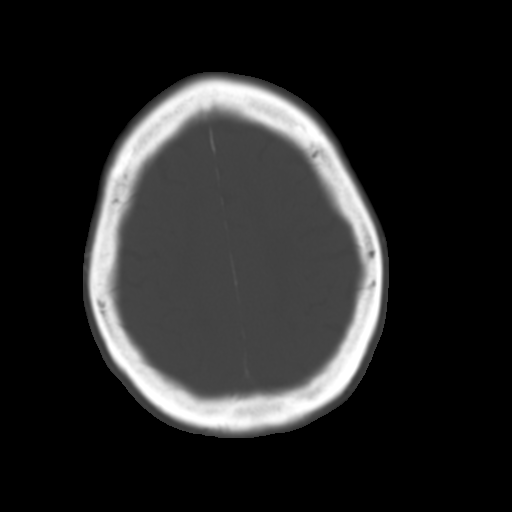
[im 23/32  brain]
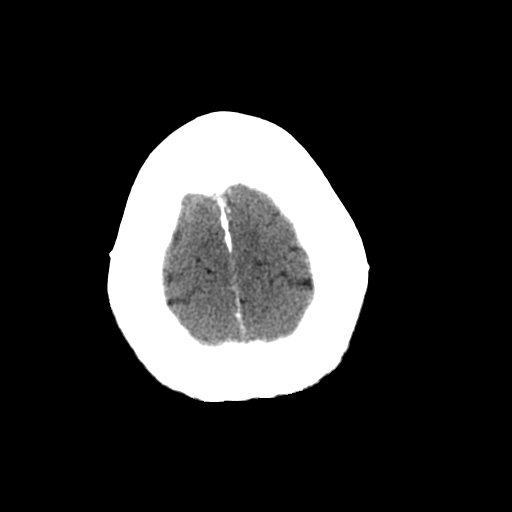
[im 25/32  brain]
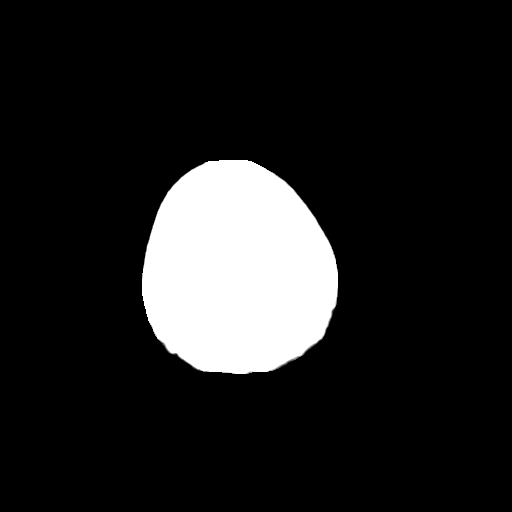
[im 27/32  brain]
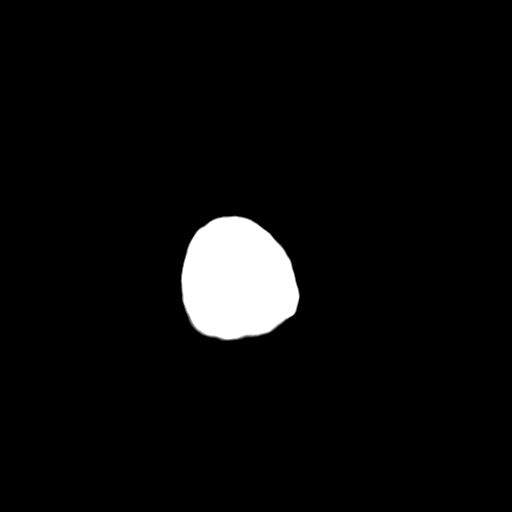
[im 29/32  brain]
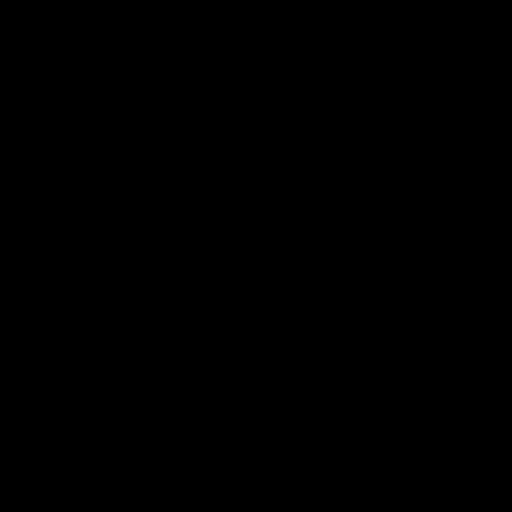
[im 29/32  bone]
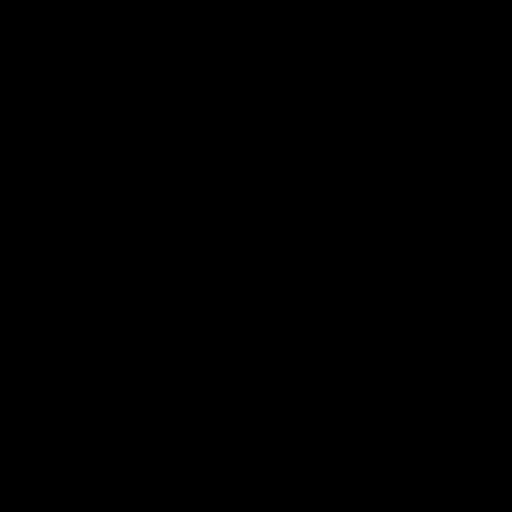

[Series 202: head w/o bone, idose (1) · axial · non-contrast · 0.44mm/px · z∈[+127,+147]mm · 2 of 32 slices shown]
[im 3/32  bone]
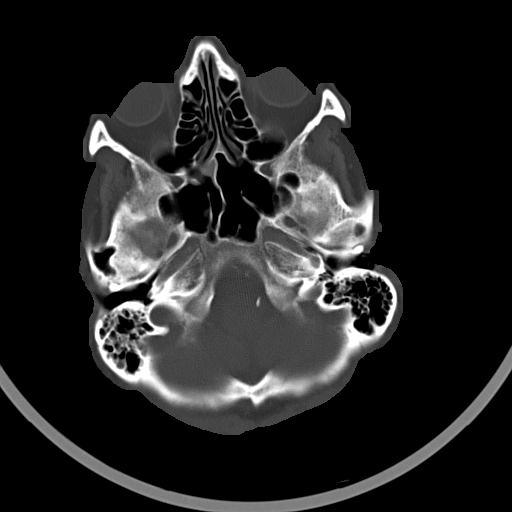
[im 7/32  bone]
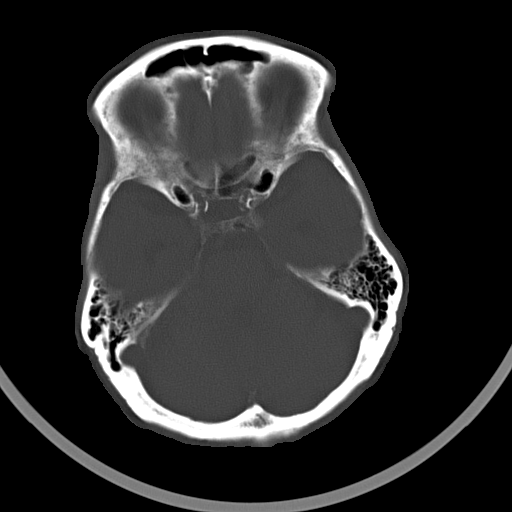

[15 of 30 positions shown; findings below may reference images not displayed]

FINDINGS: There is no evidence of acute infarction, mass lesion, or intra- or
extra-axial hemorrhage on CT.

Prominence of the ventricles and sulci reflects moderate cortical
volume loss. Mild cerebellar atrophy is noted. Diffuse
periventricular and subcortical white matter change likely reflects
small vessel ischemic microangiopathy.

The brainstem and fourth ventricle are within normal limits. The
basal ganglia are unremarkable in appearance. The cerebral
hemispheres demonstrate grossly normal gray-white differentiation.
No mass effect or midline shift is seen.

There is no evidence of fracture; visualized osseous structures are
unremarkable in appearance. The visualized portions of the orbits
are within normal limits. The paranasal sinuses and mastoid air
cells are well-aerated. No significant soft tissue abnormalities are
seen.
IMPRESSION: 1. No acute intracranial pathology seen on CT.
2. Moderate cortical volume loss and diffuse small vessel ischemic
microangiopathy.

These results were called by telephone at the time of interpretation
on 01/27/2016 at [DATE] to Dr. ENMANUEL WAH, who verbally
acknowledged these results.
